# Patient Record
Sex: Female | Born: 1960 | Race: White | Hispanic: No | Marital: Married | Smoking: Current every day smoker
Health system: Southern US, Community
[De-identification: ages and names within clinical notes are randomized; demographics above are authoritative.]

## PROBLEM LIST (undated history)

## (undated) ENCOUNTER — Emergency Department (HOSPITAL_COMMUNITY): Admission: EM | Payer: Self-pay | Source: Home / Self Care

## (undated) DIAGNOSIS — F329 Major depressive disorder, single episode, unspecified: Secondary | ICD-10-CM

## (undated) DIAGNOSIS — F32A Depression, unspecified: Secondary | ICD-10-CM

---

## 2017-02-22 ENCOUNTER — Emergency Department (HOSPITAL_COMMUNITY): Payer: Self-pay

## 2017-02-22 ENCOUNTER — Emergency Department (HOSPITAL_COMMUNITY)
Admission: EM | Admit: 2017-02-22 | Discharge: 2017-02-23 | Disposition: A | Discharge status: Home / Self Care | Payer: Self-pay | Attending: Emergency Medicine | Admitting: Emergency Medicine

## 2017-02-22 ENCOUNTER — Encounter (HOSPITAL_COMMUNITY): Payer: Self-pay | Admitting: Nurse Practitioner

## 2017-02-22 DIAGNOSIS — W51XXXA Accidental striking against or bumped into by another person, initial encounter: Secondary | ICD-10-CM | POA: Insufficient documentation

## 2017-02-22 DIAGNOSIS — F17210 Nicotine dependence, cigarettes, uncomplicated: Secondary | ICD-10-CM | POA: Insufficient documentation

## 2017-02-22 DIAGNOSIS — S20212A Contusion of left front wall of thorax, initial encounter: Secondary | ICD-10-CM | POA: Insufficient documentation

## 2017-02-22 DIAGNOSIS — Y999 Unspecified external cause status: Secondary | ICD-10-CM | POA: Insufficient documentation

## 2017-02-22 DIAGNOSIS — Y9289 Other specified places as the place of occurrence of the external cause: Secondary | ICD-10-CM | POA: Insufficient documentation

## 2017-02-22 DIAGNOSIS — Y9389 Activity, other specified: Secondary | ICD-10-CM | POA: Insufficient documentation

## 2017-02-22 DIAGNOSIS — M79601 Pain in right arm: Secondary | ICD-10-CM | POA: Insufficient documentation

## 2017-02-22 HISTORY — DX: Major depressive disorder, single episode, unspecified: F32.9

## 2017-02-22 HISTORY — DX: Depression, unspecified: F32.A
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
## 2017-02-22 NOTE — Discharge Instructions (Signed)
Take Ibuprofen or Naproxen for pain You can ice the area for 20 minutes at a time several times a day
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
